# Patient Record
Sex: Male | Born: 1980 | Hispanic: No | Smoking: Current every day smoker
Health system: Southern US, Community
[De-identification: ages and names within clinical notes are randomized; demographics above are authoritative.]

## PROBLEM LIST (undated history)

## (undated) DIAGNOSIS — I82409 Acute embolism and thrombosis of unspecified deep veins of unspecified lower extremity: Secondary | ICD-10-CM

## (undated) DIAGNOSIS — S86009A Unspecified injury of unspecified Achilles tendon, initial encounter: Secondary | ICD-10-CM

## (undated) HISTORY — PX: MYRINGOTOMY WITH TUBE PLACEMENT: SHX5663

---
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
## 2008-01-09 ENCOUNTER — Emergency Department (HOSPITAL_COMMUNITY): Admission: EM | Admit: 2008-01-09 | Discharge: 2008-01-10 | Payer: Self-pay | Admitting: Emergency Medicine

## 2008-06-15 ENCOUNTER — Emergency Department (HOSPITAL_BASED_OUTPATIENT_CLINIC_OR_DEPARTMENT_OTHER): Admission: EM | Admit: 2008-06-15 | Discharge: 2008-06-15 | Payer: Self-pay | Admitting: Emergency Medicine

## 2008-06-16 ENCOUNTER — Emergency Department (HOSPITAL_COMMUNITY): Admission: EM | Admit: 2008-06-16 | Discharge: 2008-06-16 | Payer: Self-pay | Admitting: Emergency Medicine

## 2008-07-15 ENCOUNTER — Ambulatory Visit: Payer: Self-pay | Admitting: Gastroenterology

## 2008-09-23 ENCOUNTER — Emergency Department (HOSPITAL_BASED_OUTPATIENT_CLINIC_OR_DEPARTMENT_OTHER): Admission: EM | Admit: 2008-09-23 | Discharge: 2008-09-23 | Payer: Self-pay | Admitting: Emergency Medicine

## 2008-09-23 ENCOUNTER — Ambulatory Visit: Payer: Self-pay | Admitting: Radiology

## 2008-09-29 ENCOUNTER — Emergency Department (HOSPITAL_BASED_OUTPATIENT_CLINIC_OR_DEPARTMENT_OTHER): Admission: EM | Admit: 2008-09-29 | Discharge: 2008-09-29 | Payer: Self-pay | Admitting: Emergency Medicine

## 2008-10-02 ENCOUNTER — Emergency Department (HOSPITAL_COMMUNITY): Admission: EM | Admit: 2008-10-02 | Discharge: 2008-10-02 | Payer: Self-pay | Admitting: Emergency Medicine

## 2008-10-03 ENCOUNTER — Emergency Department (HOSPITAL_BASED_OUTPATIENT_CLINIC_OR_DEPARTMENT_OTHER): Admission: EM | Admit: 2008-10-03 | Discharge: 2008-10-03 | Payer: Self-pay | Admitting: Emergency Medicine

## 2010-04-05 IMAGING — CR DG SHOULDER 2+V*L*
3 series · 3 of 3 positions shown · non-contrast
Comparison: None

CLINICAL DATA: Anterior shoulder pain following lifting injury

LEFT SHOULDER - 2+ VIEW

[w shoulder ap internal left]
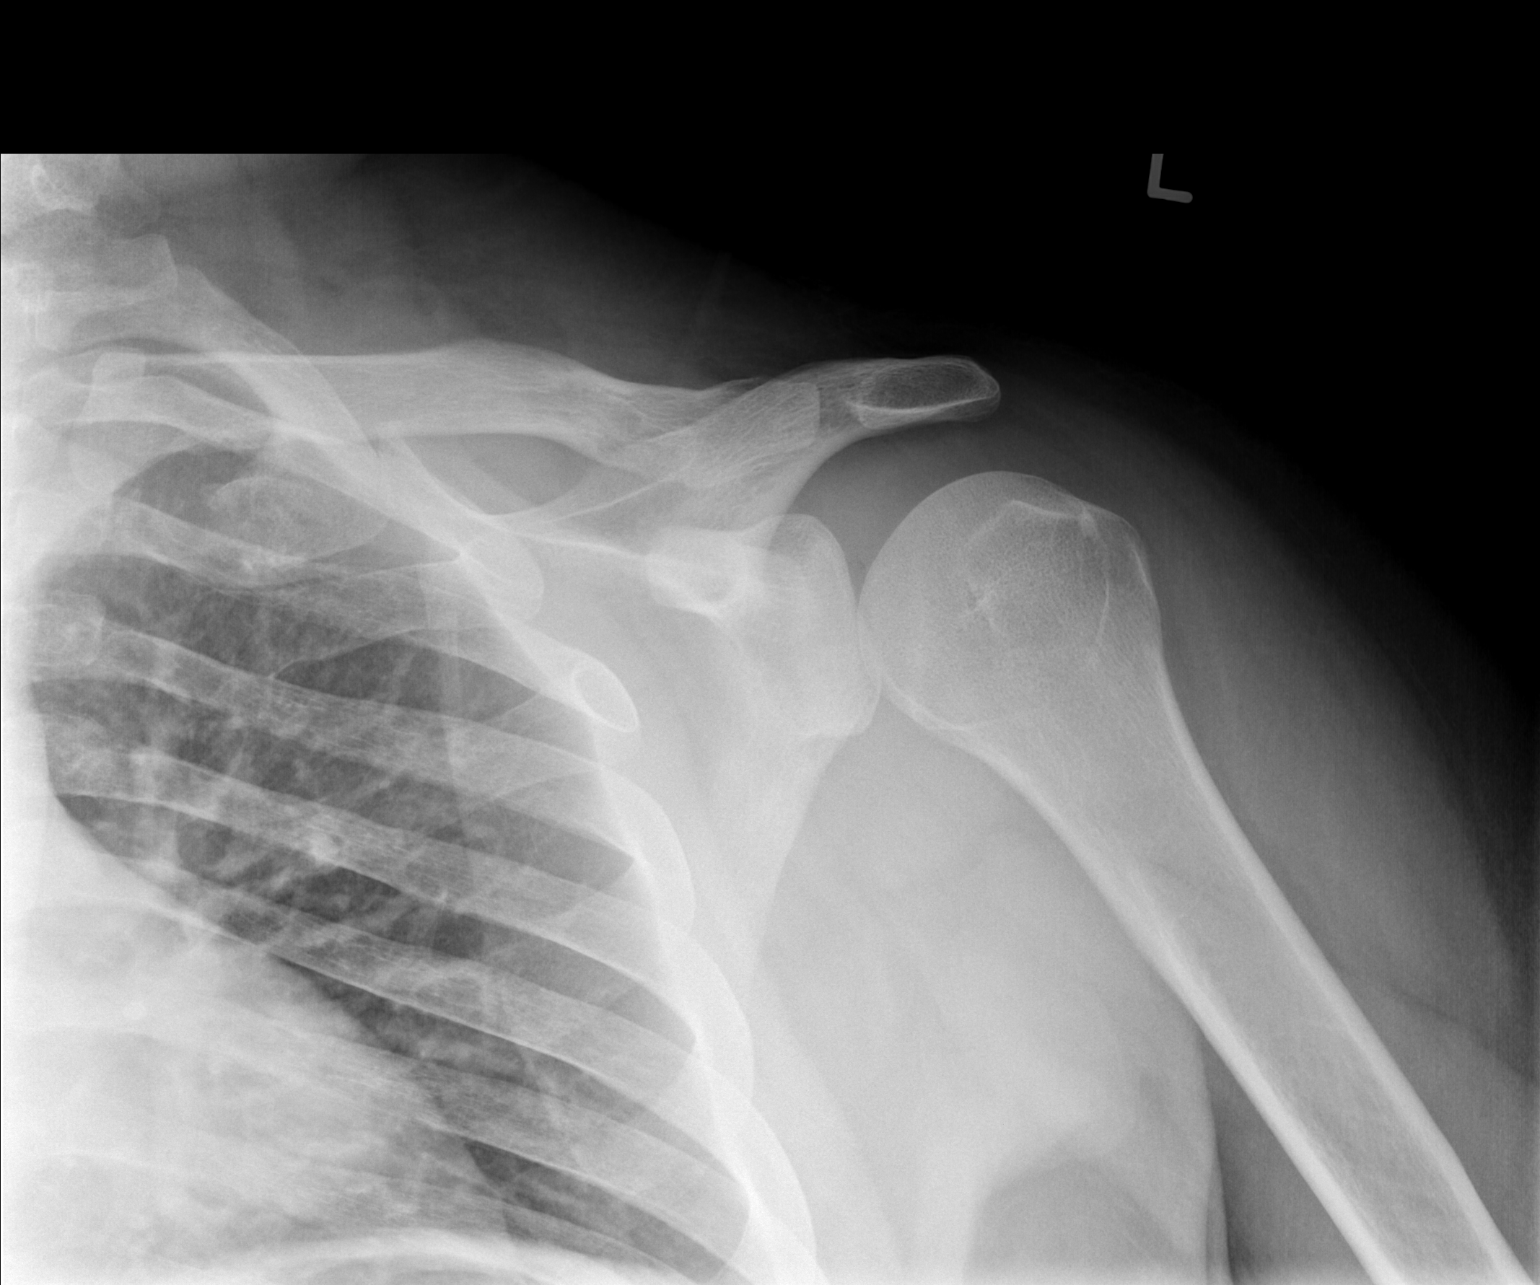

[w shoulder ap external left]
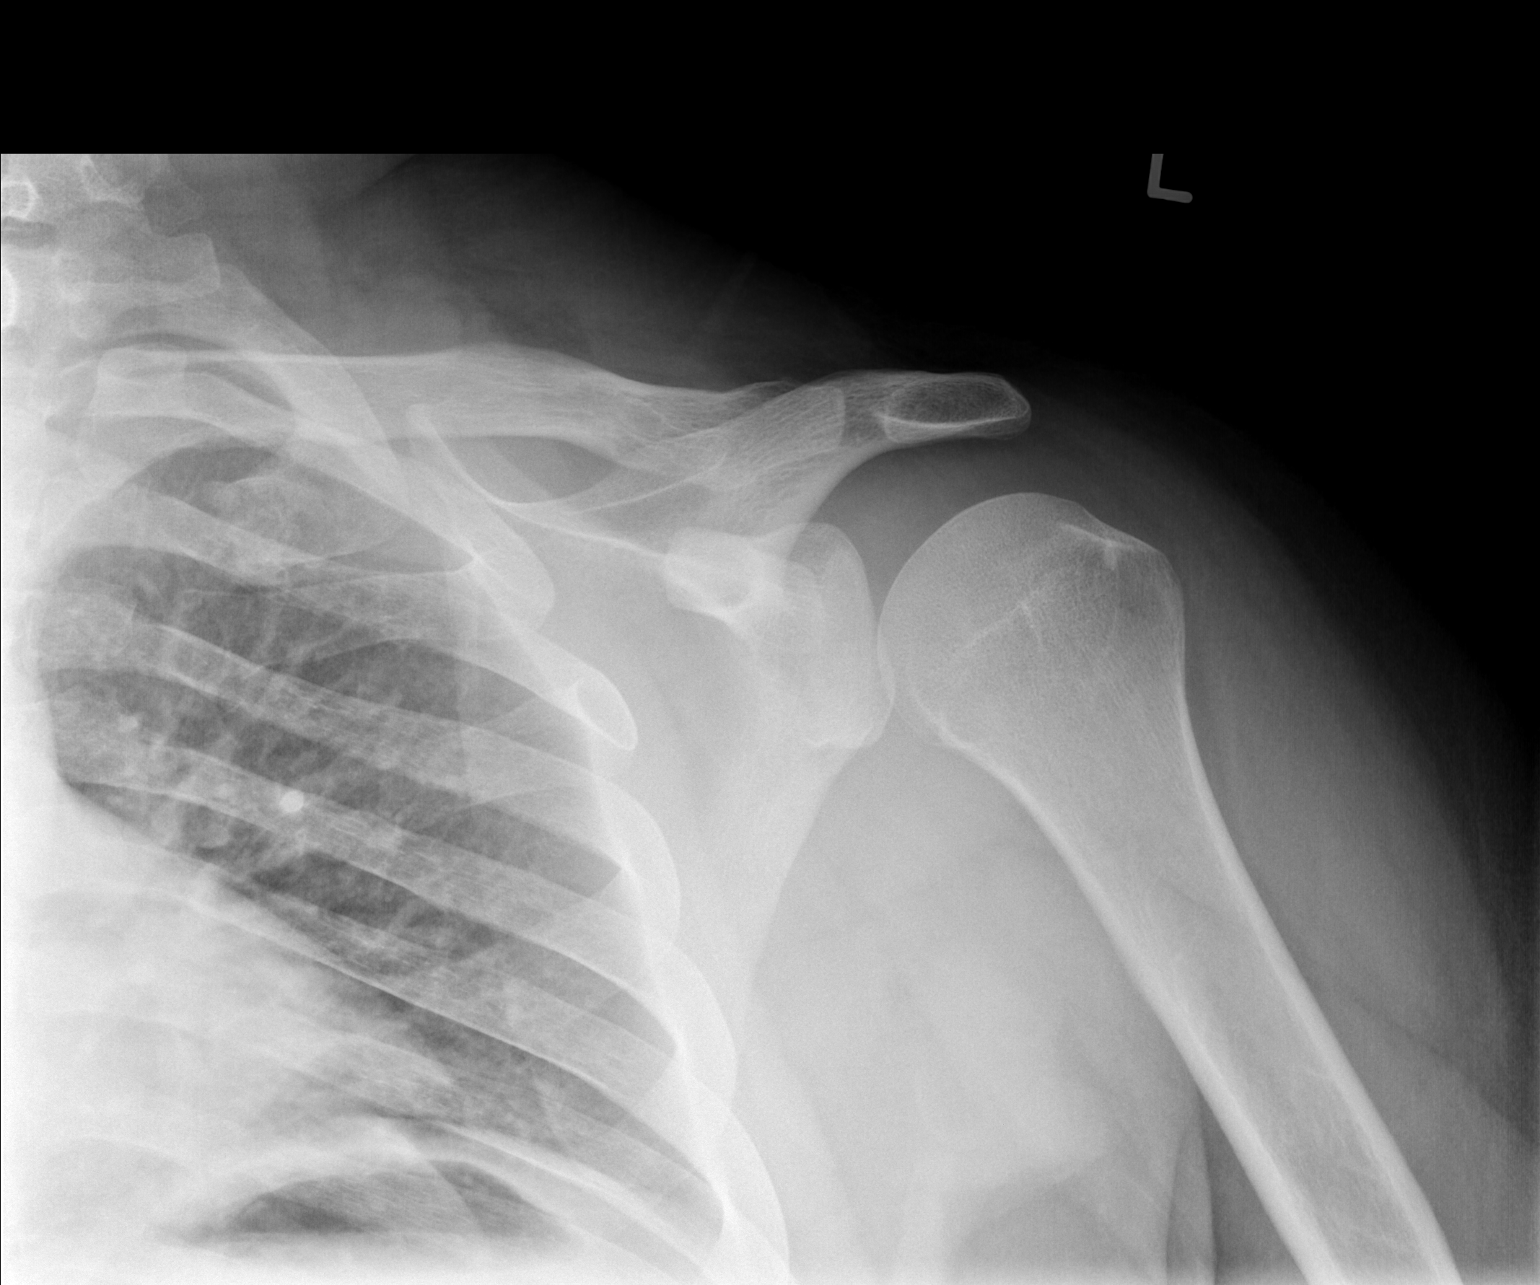

[w shoulder y view left]
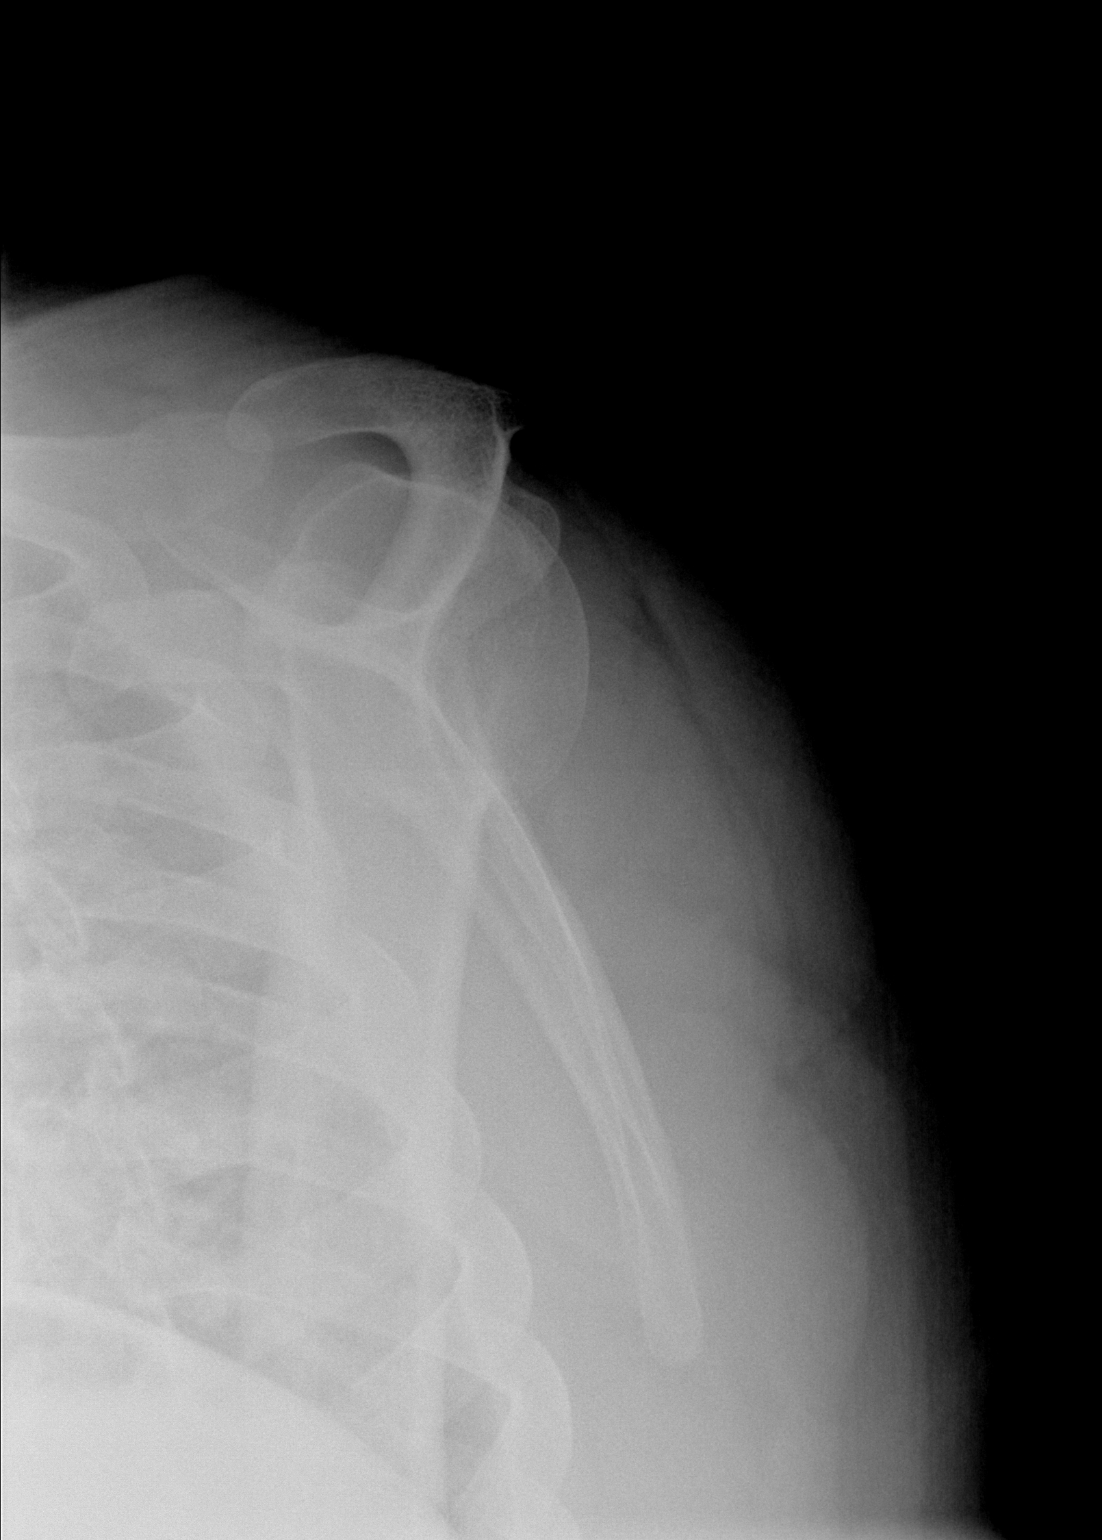

[3 of 3 positions shown; findings below may reference images not displayed]

FINDINGS: No fracture or dislocation.  No foreign body or other
abnormality of the soft tissues.
IMPRESSION: No acute or significant findings.

## 2010-04-14 IMAGING — CR DG SHOULDER 2+V*L*
2 series · 2 of 2 positions shown · non-contrast
Comparison: 09/23/2008

CLINICAL DATA: Left shoulder pain following left shoulder strain

LEFT SHOULDER - 2+ VIEW

[w shoulder ap internal left]
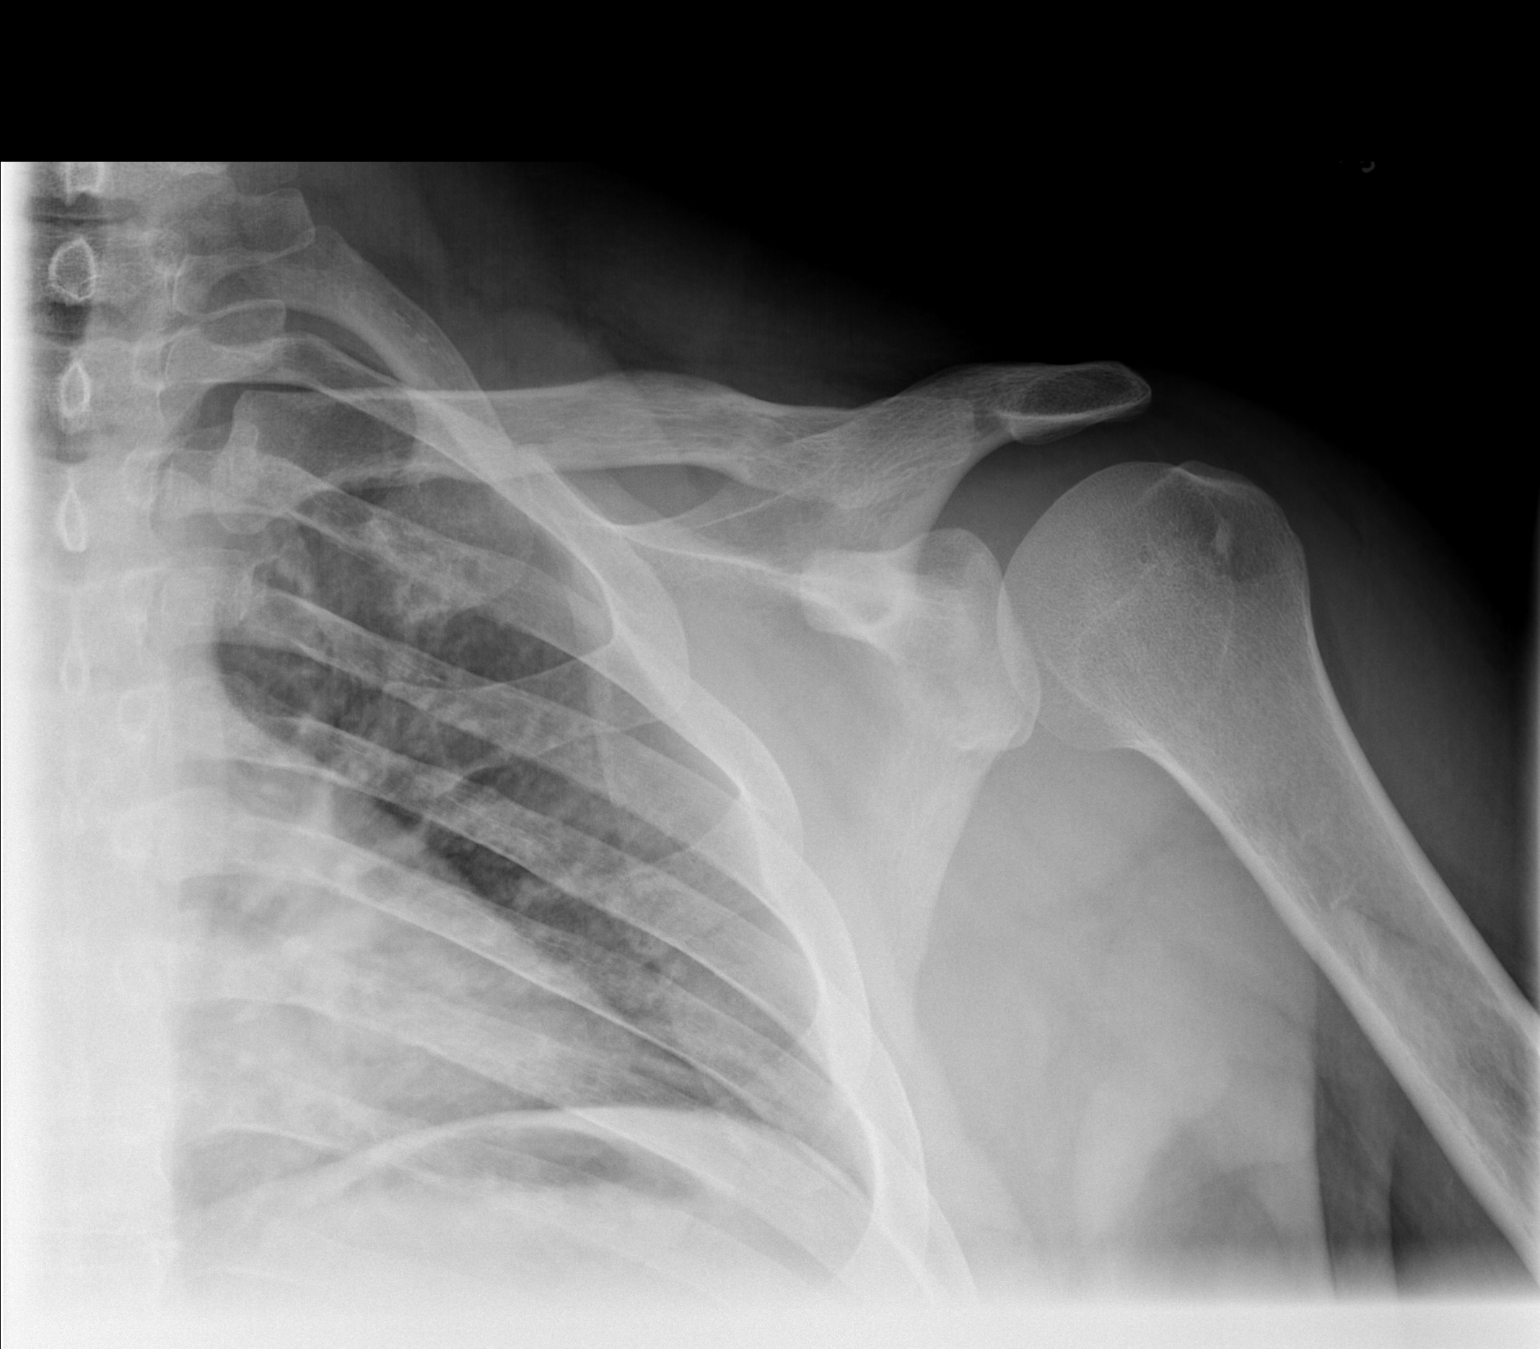

[w shoulder y view left *]
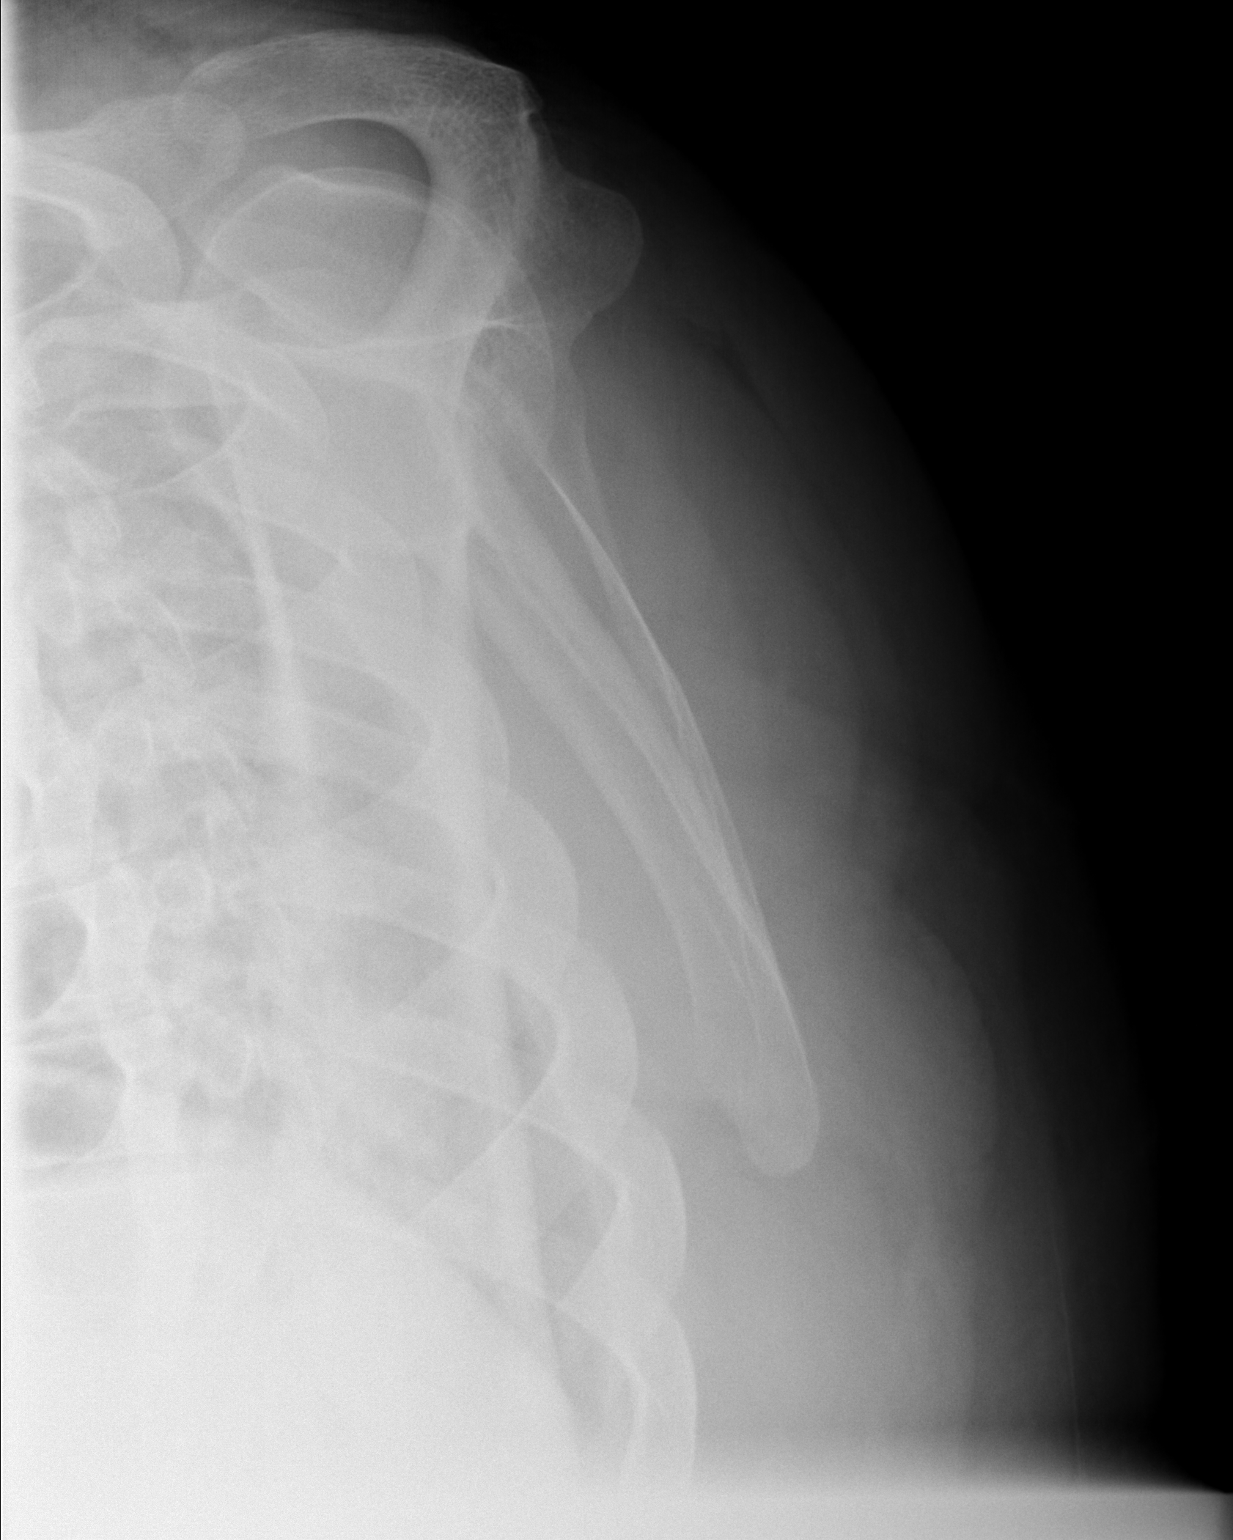

[2 of 2 positions shown; findings below may reference images not displayed]

FINDINGS: No fracture or dislocation is noted.  There is deformity
of the distal clavicle likely indicating an old healed fracture.
There is also a subtle lucency in the humeral head that is likely a
small bone cyst.
IMPRESSION: No acute findings.  Old healed fracture of the left distal clavicle
noted.

## 2010-06-17 LAB — HEMOCCULT GUIAC POC 1CARD (OFFICE): Fecal Occult Bld: NEGATIVE

## 2015-12-15 ENCOUNTER — Emergency Department (HOSPITAL_COMMUNITY)
Admission: EM | Admit: 2015-12-15 | Discharge: 2015-12-15 | Disposition: A | Discharge status: Home / Self Care | Payer: Medicaid Other | Attending: Emergency Medicine | Admitting: Emergency Medicine

## 2015-12-15 ENCOUNTER — Emergency Department (HOSPITAL_BASED_OUTPATIENT_CLINIC_OR_DEPARTMENT_OTHER)
Admit: 2015-12-15 | Discharge: 2015-12-15 | Disposition: A | Discharge status: Home / Self Care | Payer: Medicaid Other | Attending: Emergency Medicine | Admitting: Emergency Medicine

## 2015-12-15 ENCOUNTER — Emergency Department (HOSPITAL_COMMUNITY): Payer: Medicaid Other

## 2015-12-15 ENCOUNTER — Encounter (HOSPITAL_COMMUNITY): Payer: Self-pay | Admitting: Emergency Medicine

## 2015-12-15 DIAGNOSIS — R52 Pain, unspecified: Secondary | ICD-10-CM

## 2015-12-15 DIAGNOSIS — F17210 Nicotine dependence, cigarettes, uncomplicated: Secondary | ICD-10-CM | POA: Insufficient documentation

## 2015-12-15 DIAGNOSIS — Z79899 Other long term (current) drug therapy: Secondary | ICD-10-CM | POA: Insufficient documentation

## 2015-12-15 DIAGNOSIS — R609 Edema, unspecified: Secondary | ICD-10-CM

## 2015-12-15 DIAGNOSIS — M7989 Other specified soft tissue disorders: Secondary | ICD-10-CM

## 2015-12-15 DIAGNOSIS — M79662 Pain in left lower leg: Secondary | ICD-10-CM | POA: Diagnosis present

## 2015-12-15 DIAGNOSIS — M79609 Pain in unspecified limb: Secondary | ICD-10-CM

## 2015-12-15 HISTORY — DX: Acute embolism and thrombosis of unspecified deep veins of unspecified lower extremity: I82.409

## 2015-12-15 LAB — CBC WITH DIFFERENTIAL/PLATELET
BASOS PCT: 0 %
Basophils Absolute: 0 10*3/uL (ref 0.0–0.1)
EOS ABS: 0.2 10*3/uL (ref 0.0–0.7)
EOS PCT: 2 %
HCT: 47.2 % (ref 39.0–52.0)
HEMOGLOBIN: 16 g/dL (ref 13.0–17.0)
Lymphocytes Relative: 17 %
Lymphs Abs: 1.9 10*3/uL (ref 0.7–4.0)
MCH: 30.9 pg (ref 26.0–34.0)
MCHC: 33.9 g/dL (ref 30.0–36.0)
MCV: 91.3 fL (ref 78.0–100.0)
MONOS PCT: 9 %
Monocytes Absolute: 1 10*3/uL (ref 0.1–1.0)
NEUTROS PCT: 72 %
Neutro Abs: 8.4 10*3/uL — ABNORMAL HIGH (ref 1.7–7.7)
PLATELETS: 234 10*3/uL (ref 150–400)
RBC: 5.17 MIL/uL (ref 4.22–5.81)
RDW: 12.5 % (ref 11.5–15.5)
WBC: 11.6 10*3/uL — AB (ref 4.0–10.5)

## 2015-12-15 LAB — BASIC METABOLIC PANEL
Anion gap: 7 (ref 5–15)
BUN: 13 mg/dL (ref 6–20)
CALCIUM: 9 mg/dL (ref 8.9–10.3)
CHLORIDE: 103 mmol/L (ref 101–111)
CO2: 25 mmol/L (ref 22–32)
CREATININE: 1.21 mg/dL (ref 0.61–1.24)
Glucose, Bld: 78 mg/dL (ref 65–99)
Potassium: 3.5 mmol/L (ref 3.5–5.1)
SODIUM: 135 mmol/L (ref 135–145)

## 2015-12-15 LAB — PROTIME-INR
INR: 1.23
PROTHROMBIN TIME: 15.6 s — AB (ref 11.4–15.2)

## 2015-12-15 MED ORDER — HYDROMORPHONE HCL 1 MG/ML IJ SOLN
1.0000 mg | Freq: Once | INTRAMUSCULAR | Status: AC
  Administered 2015-12-15: 1 mg via INTRAVENOUS
  Filled 2015-12-15: qty 1

## 2015-12-15 MED ORDER — KETOROLAC TROMETHAMINE 30 MG/ML IJ SOLN
10.0000 mg | Freq: Once | INTRAMUSCULAR | Status: AC
  Administered 2015-12-15: 9.9 mg via INTRAVENOUS
  Filled 2015-12-15: qty 1

## 2015-12-15 MED ORDER — SODIUM CHLORIDE 0.9 % IV BOLUS (SEPSIS)
1000.0000 mL | Freq: Once | INTRAVENOUS | Status: AC
  Administered 2015-12-15: 1000 mL via INTRAVENOUS

## 2015-12-15 MED ORDER — HYDROCODONE-ACETAMINOPHEN 5-325 MG PO TABS: 1.0000 | ORAL_TABLET | Freq: Four times a day (QID) | ORAL | 0 refills | Status: AC | PRN

## 2015-12-15 NOTE — Progress Notes (Signed)
**  Preliminary report by tech**  Left lower extremity venous duplex completed. Technically difficult study due to patient pain tolerance, edema, depth of vessels, and acoustic shadowing. Unable to rule out DVT in the left peroneal veins due to non-visualization secondary to pain and edema. All other vizualized vessels appeared patent and compressible. There is no evidence of a Baker's cyst on the left.  The results were given to the patient's nurse, Landis GandyIkrame.  12/15/15 1:12 PM Olen CordialGreg Clarinda Obi RVT

## 2015-12-15 NOTE — Progress Notes (Addendum)
Pt confirms pcp is at [facility] pt has an f/u appt for 12/16/15 Pt started on xarelto on 9125/17  Has denied issues with cost, getting xarelto nor taking it  Reports at his [facility] xarelto had to be ordered before he got it from them

## 2015-12-15 NOTE — ED Provider Notes (Signed)
[facility]-EMERGENCY DEPT Provider Note   CSN: [id number] Arrival date & time: 12/15/15  1055     History   Chief Complaint Chief Complaint  Patient presents with  . blood clot in LLE  . Leg Swelling  . Leg Pain    HPI Austin Zhang is a 35 y.o. male.  HPI Patient presents with concern of pain in his left lower extremity. Patient has recent diagnosis of DVT, has been taking Xarelto for 2 weeks. However, 2 days ago, the patient felt acute change in his condition, with hearing an audible pop while stepping off a curb. Since that time he has had increasing swelling throughout the calf, as well as numbness in the foot. No new chest pain, dyspnea, syncope, other complaints. Symptoms not improved with OTC medication, and the patient continues to take his Xarelto. He did not have a fall, denies other new concerns.   Past Medical History:  Diagnosis Date  . DVT (deep venous thrombosis) (HCC)     There are no active problems to display for this patient.   History reviewed. No pertinent surgical history.     Home Medications    Prior to Admission medications   Medication Sig Start Date End Date Taking? Authorizing Provider  ibuprofen (ADVIL,MOTRIN) 200 MG tablet Take 600 mg by mouth every 6 (six) hours as needed for mild pain.   Yes Historical Provider, MD  Rivaroxaban (XARELTO STARTER PACK) 15 & 20 MG TBPK Take 15-20 mg by mouth 2 (two) times daily. Take as directed on package: Start with one 15mg  tablet by mouth twice a day with food. On Day 22, switch to one 20mg  tablet once a day with food.   Yes Historical Provider, MD    Family History No family history on file.  Social History Social History  Substance Use Topics  . Smoking status: Current Every Day Smoker  . Smokeless tobacco: Never Used  . Alcohol use No     Allergies   Penicillins   Review of Systems Review of Systems  Constitutional:       Per HPI, otherwise negative  HENT:       Per HPI,  otherwise negative  Respiratory:       Per HPI, otherwise negative  Cardiovascular:       Per HPI, otherwise negative  Gastrointestinal: Negative for vomiting.  Endocrine:       Negative aside from HPI  Genitourinary:       Neg aside from HPI   Musculoskeletal:       Per HPI, otherwise negative  Skin: Negative.   Allergic/Immunologic: Negative for immunocompromised state.  Neurological: Negative for syncope.  Hematological:       Anticoagulated with Xarelto currently, otherwise unremarkable     Physical Exam Updated Vital Signs BP 121/75 (BP Location: Left Arm)   Pulse 91   Temp 98.6 F (37 C) (Oral)   Resp 18   Ht 5\' 7"  (1.702 m)   Wt 298 lb (135.2 kg)   SpO2 96%   BMI 46.67 kg/m   Physical Exam  Constitutional: He is oriented to person, place, and time. He appears well-developed. No distress.  HENT:  Head: Normocephalic and atraumatic.  Eyes: Conjunctivae and EOM are normal.  Cardiovascular: Normal rate and regular rhythm.   Pulmonary/Chest: Effort normal. No stridor. No respiratory distress.  Abdominal: He exhibits no distension.  Musculoskeletal: He exhibits no edema.  Left calf is notably swollen compared to the contralateral side. There is decreased Achilles  tendon function, no substantial deformity appreciated on the insertion distally. Sensation preserved throughout the foot. Proximal left lower extremity unremarkable  Neurological: He is alert and oriented to person, place, and time.  Skin: Skin is warm and dry.  Psychiatric: He has a normal mood and affect.  Nursing note and vitals reviewed.    ED Treatments / Results  Labs (all labs ordered are listed, but only abnormal results are displayed) Labs Reviewed  PROTIME-INR - Abnormal; Notable for the following:       Result Value   Prothrombin Time 15.6 (*)    All other components within normal limits  CBC WITH DIFFERENTIAL/PLATELET - Abnormal; Notable for the following:    WBC 11.6 (*)    Neutro  Abs 8.4 (*)    All other components within normal limits  BASIC METABOLIC PANEL     Radiology Mr Ankle Left  Wo Contrast  Result Date: 12/15/2015 CLINICAL DATA:  Left ankle pain after a popping injury while stepping from a curb on December 04, 2015. Difficulty bearing weight. Swelling in the calf. EXAM: MRI OF THE LEFT ANKLE WITHOUT CONTRAST TECHNIQUE: Multiplanar, multisequence MR imaging of the ankle was performed. No intravenous contrast was administered. COMPARISON:  None. FINDINGS: TENDONS Peroneal: Unremarkable Posteromedial: Moderate distal tibialis posterior tendinopathy. Anterior: Unremarkable Achilles: Minimal distal Achilles tendinopathy.  No tear. Plantar Fascia: Mild fusiform thickening of the medial band of the plantar fascia, probably from plantar fasciitis, but possibly from mild plantar fibromatosis. LIGAMENTS Lateral: Slightly attenuated anterior talofibular ligament. Medial: Abnormally thickened superomedial portion of the spring ligament, image 16/6 and image 18/3. CARTILAGE Ankle Joint: 0.9 by 0.7 cm osteochondral lesion of the medial talar dome, image 15/6, no fragmentation or overlying chondral defect. Subtalar Joints/Sinus Tarsi: Unremarkable Bones: Unremarkable Other: Diffuse subcutaneous edema circumferentially around the ankle and tracking in the dorsum of the foot laterally. Low-level edema in Kager's fat pad. IMPRESSION: 1. Moderate distal tibialis posterior tendinopathy with adjacent considerable thickening of the superomedial portion of the spring ligament. Correlate clinically in assessing for tibialis posterior tendinopathy. 2. Diffuse subcutaneous edema around the ankle tracking in the dorsum of the lateral foot. 3. Mild fusiform thickening of the medial band of the plantar fascia, potentially from plantar fasciitis or mild plantar fibromatosis. 4. Mildly attenuated anterior talofibular ligament suggests a prior injury which has healed. 5. 9 by 7 mm osteochondral lesion of  the medial talar dome without fragmentation. Electronically Signed   By: Gaylyn RongWalter  Liebkemann M.D.   On: 12/15/2015 14:28    Procedures Procedures (including critical care time)  Medications Ordered in ED Medications  sodium chloride 0.9 % bolus 1,000 mL (1,000 mLs Intravenous New Bag/Given 12/15/15 1222)  ketorolac (TORADOL) 30 MG/ML injection 9.9 mg (9.9 mg Intravenous Given 12/15/15 1219)  HYDROmorphone (DILAUDID) injection 1 mg (1 mg Intravenous Given 12/15/15 1219)     Initial Impression / Assessment and Plan / ED Course  I have reviewed the triage vital signs and the nursing notes.  Pertinent labs & imaging results that were available during my care of the patient were reviewed by me and considered in my medical decision making (see chart for details).  Clinical Course  I discussed patient's MRI results, ultrasound results with our orthopedic colleagues. Patient will be seen in the office.  4:50 PM After already having discussed the patient's case with our orthopedic team, I discussed findings with patient and his companion. Specifically we discussed the need for immobilization, nonweightbearing, elevation, close outpatient follow-up.  Patient immobilized  with cam, crutches, well tolerated. Final Clinical Impressions(s) / ED Diagnoses  And male with recently diagnosed DVT of the left lower extremity presents with worsening pain, swelling in the distal left lower extremity. Here the patient is awake and alert, has preserved, though minimal function of the distal nerves, sensation is intact, circulation is intact. Ultrasound does not show notable progression of disease, but MRI demonstrates inflammatory changes, edema, tendinopathy. Given the patient's willingness to move, there is low suspicion for compartment syndrome currently. All findings discussed with our orthopedic team, facilitate outpatient evaluation tomorrow or the next day in the office. Patient received crutches, Cam  Walker mobilization, was discharged in stable condition with outpatient follow-up.   Gerhard Munch, MD 12/15/15 [phone]

## 2015-12-15 NOTE — Discharge Instructions (Signed)
As discussed, it is important that you follow-up with our orthopedist for further evaluation of your leg pain and swelling. Until you have seen in our orthopedist, rest, keep the leg elevated as much as possible, and minimize weightbearing.  Return here for concerning changes in your condition.

## 2015-12-15 NOTE — Progress Notes (Signed)
Entered in d/c instructions [facility]     [address] [place], [place] [address]  Phone: [phone]   Next Steps: Go on 12/16/2015

## 2015-12-15 NOTE — ED Triage Notes (Signed)
Patient was seen for blood clot in LLE and started on Xeralto.  Patient c/o pain, numbness and increase in swelling to LLE.

## 2015-12-17 ENCOUNTER — Emergency Department (HOSPITAL_BASED_OUTPATIENT_CLINIC_OR_DEPARTMENT_OTHER): Payer: Medicaid Other

## 2015-12-17 ENCOUNTER — Emergency Department (HOSPITAL_BASED_OUTPATIENT_CLINIC_OR_DEPARTMENT_OTHER)
Admission: EM | Admit: 2015-12-17 | Discharge: 2015-12-18 | Disposition: A | Discharge status: Home / Self Care | Payer: Medicaid Other | Attending: Emergency Medicine | Admitting: Emergency Medicine

## 2015-12-17 ENCOUNTER — Encounter (HOSPITAL_BASED_OUTPATIENT_CLINIC_OR_DEPARTMENT_OTHER): Payer: Self-pay

## 2015-12-17 DIAGNOSIS — R69 Illness, unspecified: Secondary | ICD-10-CM

## 2015-12-17 DIAGNOSIS — F17210 Nicotine dependence, cigarettes, uncomplicated: Secondary | ICD-10-CM | POA: Insufficient documentation

## 2015-12-17 DIAGNOSIS — J111 Influenza due to unidentified influenza virus with other respiratory manifestations: Secondary | ICD-10-CM | POA: Insufficient documentation

## 2015-12-17 DIAGNOSIS — Z79899 Other long term (current) drug therapy: Secondary | ICD-10-CM | POA: Diagnosis not present

## 2015-12-17 DIAGNOSIS — R0602 Shortness of breath: Secondary | ICD-10-CM | POA: Diagnosis present

## 2015-12-17 HISTORY — DX: Unspecified injury of unspecified achilles tendon, initial encounter: S86.009A

## 2015-12-17 MED ORDER — IBUPROFEN 800 MG PO TABS
800.0000 mg | ORAL_TABLET | Freq: Once | ORAL | Status: AC
  Administered 2015-12-17: 800 mg via ORAL
  Filled 2015-12-17: qty 1

## 2015-12-17 NOTE — ED Provider Notes (Signed)
[facility] Provider Note: Austin Dell, MD, FACEP  CSN: [id number] MRN: [id number] ARRIVAL: 12/17/15 at 2106  By signing my name below, I, Austin Zhang, attest that this documentation has been prepared under the direction and in the presence of Paula Libra, MD  Electronically Signed: Clovis Zhang, ED Scribe. 12/18/15. 12:02 AM.   CHIEF COMPLAINT  Shortness of Breath   HISTORY OF PRESENT ILLNESS  Austin Zhang is a 35 y.o. male, with a Recently diagnosed DVT of LLE, presents to the ED complaining of moderate SOB x 1 night. He has had associated fever with a TMAX 102.2, fatigue, decreased energy, and nausea. Pt denies vomiting, diarrhea, cough, runny nose, and sore throat. Pt has been taking the Xarelto starter pack for his DVT.    Past Medical History:  Diagnosis Date  . Achilles tendon injury   . DVT (deep venous thrombosis) (HCC)     Past Surgical History:  Procedure Laterality Date  . MYRINGOTOMY WITH TUBE PLACEMENT      No family history on file.  Social History  Substance Use Topics  . Smoking status: Current Every Day Smoker    Types: Cigarettes  . Smokeless tobacco: Never Used  . Alcohol use No    Prior to Admission medications   Medication Sig Start Date End Date Taking? Authorizing Provider  HYDROcodone-acetaminophen (NORCO/VICODIN) 5-325 MG tablet Take 1 tablet by mouth every 6 (six) hours as needed for severe pain. 12/15/15   Gerhard Munch, MD  Rivaroxaban (XARELTO STARTER PACK) 15 & 20 MG TBPK Take 15-20 mg by mouth 2 (two) times daily. Take as directed on package: Start with one 15mg  tablet by mouth twice a day with food. On Day 22, switch to one 20mg  tablet once a day with food.    Historical Provider, MD    Allergies Penicillins   REVIEW OF SYSTEMS  Negative except as noted here or in the History of Present Illness.   PHYSICAL EXAMINATION  Initial Vital Signs Blood pressure 109/58, pulse 93, temperature 102 F (38.9 C),  temperature source Oral, resp. rate 20, height 5\' 7"  (1.702 m), weight 298 lb (135.2 kg), SpO2 96 %.  Examination General: Well-developed, obese male in no acute distress; appearance consistent with age of record HENT: normocephalic; atraumatic Eyes: pupils equal, round and reactive to light; extraocular muscles intact Neck: supple Heart: regular rate and rhythm Lungs: Decreased air movement bilaterally without wheezing Abdomen: soft; nondistended; nontender; no masses or hepatosplenomegaly; bowel sounds present Extremities: No deformity; full range of motion; pulses normal; tenderness and edema of L calf Neurologic: Awake, alert; motor function intact in all extremities and symmetric; no facial droop Skin: Warm and dry Psychiatric: flat affect   RESULTS  Summary of this visit's results, reviewed by myself:   EKG Interpretation  Date/Time:    Ventricular Rate:    PR Interval:    QRS Duration:   QT Interval:    QTC Calculation:   R Axis:     Text Interpretation:        Laboratory Studies: Results for orders placed or performed during the hospital encounter of 12/17/15 (from the past 24 hour(s))  CBC with Differential/Platelet     Status: Abnormal   Collection Time: 12/18/15 12:25 AM  Result Value Ref Range   WBC 7.9 4.0 - 10.5 K/uL   RBC 4.42 4.22 - 5.81 MIL/uL   Hemoglobin 13.8 13.0 - 17.0 g/dL   HCT 57.8 46.9 - 62.9 %   MCV 91.2 78.0 -  100.0 fL   MCH 31.2 26.0 - 34.0 pg   MCHC 34.2 30.0 - 36.0 g/dL   RDW 16.112.5 09.611.5 - 04.515.5 %   Platelets 198 150 - 400 K/uL   Neutrophils Relative % 63 %   Neutro Abs 5.0 1.7 - 7.7 K/uL   Lymphocytes Relative 19 %   Lymphs Abs 1.5 0.7 - 4.0 K/uL   Monocytes Relative 17 %   Monocytes Absolute 1.3 (H) 0.1 - 1.0 K/uL   Eosinophils Relative 1 %   Eosinophils Absolute 0.1 0.0 - 0.7 K/uL   Basophils Relative 0 %   Basophils Absolute 0.0 0.0 - 0.1 K/uL  Basic metabolic panel     Status: Abnormal   Collection Time: 12/18/15 12:25 AM    Result Value Ref Range   Sodium 134 (L) 135 - 145 mmol/L   Potassium 3.5 3.5 - 5.1 mmol/L   Chloride 102 101 - 111 mmol/L   CO2 23 22 - 32 mmol/L   Glucose, Bld 97 65 - 99 mg/dL   BUN 13 6 - 20 mg/dL   Creatinine, Ser 4.091.15 0.61 - 1.24 mg/dL   Calcium 8.9 8.9 - 81.110.3 mg/dL   GFR calc non Af Amer >60 >60 mL/min   GFR calc Af Amer >60 >60 mL/min   Anion gap 9 5 - 15  I-Stat CG4 Lactic Acid, ED     Status: None   Collection Time: 12/18/15 12:36 AM  Result Value Ref Range   Lactic Acid, Venous 0.95 0.5 - 1.9 mmol/L   Imaging Studies: Dg Chest 2 View  Result Date: 12/17/2015 CLINICAL DATA:  Fever with shortness of breath EXAM: CHEST  2 VIEW COMPARISON:  07/16/2005 FINDINGS: The heart size and mediastinal contours are within normal limits. Both lungs are clear. Mild lower anterior vertebral wedging. IMPRESSION: No acute infiltrate or edema Electronically Signed   By: Jasmine PangKim  Fujinaga M.D.   On: 12/17/2015 23:57    ED COURSE  Nursing notes and initial vitals signs, including pulse oximetry, reviewed.  Vitals:   12/17/15 2242 12/17/15 2323 12/18/15 0036 12/18/15 0040  BP:  109/58    Pulse:  93    Resp:  20    Temp: 102.2 F (39 C) 102 F (38.9 C)  100.2 F (37.9 C)  TempSrc:  Oral    SpO2:  96% 96%   Weight:      Height:       1:06 AM Air movement improved after DuoNeb treatment. History and exam consistent with a viral respiratory illness.  PROCEDURES    ED DIAGNOSES     ICD-9-CM ICD-10-CM   1. Influenza-like illness 799.89 R69     I personally performed the services described in this documentation, which was scribed in my presence. The recorded information has been reviewed and is accurate.     Paula LibraJohn Fanta Wimberley, MD 12/18/15 [phone]

## 2015-12-17 NOTE — ED Notes (Addendum)
C/o ha, chills, sob, fever  onset last pm being treated for left leg dvt  No distress noted

## 2015-12-17 NOTE — ED Triage Notes (Addendum)
C/o SOB yesterday-being treated for DVT left LE-cam walker in place to LE due to achilles tendon injury-presents to triage in w/c-NAD

## 2015-12-18 LAB — BASIC METABOLIC PANEL
Anion gap: 9 (ref 5–15)
BUN: 13 mg/dL (ref 6–20)
CHLORIDE: 102 mmol/L (ref 101–111)
CO2: 23 mmol/L (ref 22–32)
Calcium: 8.9 mg/dL (ref 8.9–10.3)
Creatinine, Ser: 1.15 mg/dL (ref 0.61–1.24)
GFR calc Af Amer: >60 mL/min (ref >60)
GFR calc non Af Amer: >60 mL/min (ref >60)
GLUCOSE: 97 mg/dL (ref 65–99)
POTASSIUM: 3.5 mmol/L (ref 3.5–5.1)
Sodium: 134 mmol/L — ABNORMAL LOW (ref 135–145)

## 2015-12-18 LAB — CBC WITH DIFFERENTIAL/PLATELET
Basophils Absolute: 0 10*3/uL (ref 0.0–0.1)
Basophils Relative: 0 %
Eosinophils Absolute: 0.1 10*3/uL (ref 0.0–0.7)
Eosinophils Relative: 1 %
HEMATOCRIT: 40.3 % (ref 39.0–52.0)
HEMOGLOBIN: 13.8 g/dL (ref 13.0–17.0)
LYMPHS ABS: 1.5 10*3/uL (ref 0.7–4.0)
Lymphocytes Relative: 19 %
MCH: 31.2 pg (ref 26.0–34.0)
MCHC: 34.2 g/dL (ref 30.0–36.0)
MCV: 91.2 fL (ref 78.0–100.0)
MONO ABS: 1.3 10*3/uL — AB (ref 0.1–1.0)
MONOS PCT: 17 %
NEUTROS ABS: 5 10*3/uL (ref 1.7–7.7)
NEUTROS PCT: 63 %
Platelets: 198 10*3/uL (ref 150–400)
RBC: 4.42 MIL/uL (ref 4.22–5.81)
RDW: 12.5 % (ref 11.5–15.5)
WBC: 7.9 10*3/uL (ref 4.0–10.5)

## 2015-12-18 LAB — I-STAT CG4 LACTIC ACID, ED: Lactic Acid, Venous: 0.95 mmol/L (ref 0.5–1.9)

## 2015-12-18 MED ORDER — IPRATROPIUM-ALBUTEROL 0.5-2.5 (3) MG/3ML IN SOLN
3.0000 mL | RESPIRATORY_TRACT | Status: DC
  Administered 2015-12-18: 3 mL via RESPIRATORY_TRACT
  Filled 2015-12-18: qty 3

## 2015-12-18 MED ORDER — ALBUTEROL SULFATE HFA 108 (90 BASE) MCG/ACT IN AERS
2.0000 | INHALATION_SPRAY | RESPIRATORY_TRACT | Status: DC | PRN
  Administered 2015-12-18: 2 via RESPIRATORY_TRACT
  Filled 2015-12-18: qty 6.7
